# Patient Record
Sex: Female | Born: 2000 | Race: Black or African American | Hispanic: No | Marital: Single | Smoking: Never smoker
Health system: Southern US, Community
[De-identification: ages and names within clinical notes are randomized; demographics above are authoritative.]

## PROBLEM LIST (undated history)

## (undated) DIAGNOSIS — J45909 Unspecified asthma, uncomplicated: Secondary | ICD-10-CM

---
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
## 2021-01-26 DIAGNOSIS — Z419 Encounter for procedure for purposes other than remedying health state, unspecified: Secondary | ICD-10-CM | POA: Diagnosis not present

## 2021-02-25 DIAGNOSIS — Z419 Encounter for procedure for purposes other than remedying health state, unspecified: Secondary | ICD-10-CM | POA: Diagnosis not present

## 2021-03-28 DIAGNOSIS — Z419 Encounter for procedure for purposes other than remedying health state, unspecified: Secondary | ICD-10-CM | POA: Diagnosis not present

## 2021-04-27 DIAGNOSIS — Z419 Encounter for procedure for purposes other than remedying health state, unspecified: Secondary | ICD-10-CM | POA: Diagnosis not present

## 2023-01-21 ENCOUNTER — Other Ambulatory Visit: Payer: Self-pay

## 2023-01-21 ENCOUNTER — Encounter (HOSPITAL_COMMUNITY): Payer: Self-pay

## 2023-01-21 ENCOUNTER — Emergency Department (HOSPITAL_COMMUNITY)
Admission: EM | Admit: 2023-01-21 | Discharge: 2023-01-21 | Disposition: A | Discharge status: Home / Self Care | Payer: Medicaid Other | Attending: Emergency Medicine | Admitting: Emergency Medicine

## 2023-01-21 DIAGNOSIS — S199XXA Unspecified injury of neck, initial encounter: Secondary | ICD-10-CM | POA: Diagnosis present

## 2023-01-21 DIAGNOSIS — S39012A Strain of muscle, fascia and tendon of lower back, initial encounter: Secondary | ICD-10-CM | POA: Diagnosis not present

## 2023-01-21 DIAGNOSIS — Y92410 Unspecified street and highway as the place of occurrence of the external cause: Secondary | ICD-10-CM | POA: Diagnosis not present

## 2023-01-21 DIAGNOSIS — S161XXA Strain of muscle, fascia and tendon at neck level, initial encounter: Secondary | ICD-10-CM | POA: Diagnosis not present

## 2023-01-21 DIAGNOSIS — R519 Headache, unspecified: Secondary | ICD-10-CM | POA: Insufficient documentation

## 2023-01-21 DIAGNOSIS — M25511 Pain in right shoulder: Secondary | ICD-10-CM | POA: Insufficient documentation

## 2023-01-21 MED ORDER — METHOCARBAMOL 500 MG PO TABS: 500.0000 mg | ORAL_TABLET | Freq: Two times a day (BID) | ORAL | 0 refills | Status: AC

## 2023-01-21 MED ORDER — ACETAMINOPHEN 325 MG PO TABS
650.0000 mg | ORAL_TABLET | Freq: Once | ORAL | Status: AC
  Administered 2023-01-21: 650 mg via ORAL
  Filled 2023-01-21: qty 2
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
## 2023-01-21 NOTE — Discharge Instructions (Signed)
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
## 2023-05-22 ENCOUNTER — Ambulatory Visit: Payer: Self-pay | Admitting: Family Medicine

## 2024-06-01 ENCOUNTER — Ambulatory Visit (HOSPITAL_COMMUNITY)
Admission: EM | Admit: 2024-06-01 | Discharge: 2024-06-01 | Disposition: A | Discharge status: Home / Self Care | Attending: Family Medicine | Admitting: Family Medicine

## 2024-06-01 ENCOUNTER — Encounter (HOSPITAL_COMMUNITY): Payer: Self-pay

## 2024-06-01 DIAGNOSIS — R1010 Upper abdominal pain, unspecified: Secondary | ICD-10-CM | POA: Diagnosis not present

## 2024-06-01 DIAGNOSIS — K219 Gastro-esophageal reflux disease without esophagitis: Secondary | ICD-10-CM | POA: Diagnosis not present

## 2024-06-01 LAB — POCT URINALYSIS DIP (MANUAL ENTRY)
Bilirubin, UA: NEGATIVE
Blood, UA: NEGATIVE
Glucose, UA: NEGATIVE mg/dL
Leukocytes, UA: NEGATIVE
Nitrite, UA: NEGATIVE
Protein Ur, POC: NEGATIVE mg/dL
Spec Grav, UA: 1.02 (ref 1.010–1.025)
Urobilinogen, UA: 1 U/dL
pH, UA: 7 (ref 5.0–8.0)

## 2024-06-01 MED ORDER — SUCRALFATE 1 G PO TABS: 1.0000 g | ORAL_TABLET | Freq: Three times a day (TID) | ORAL | 0 refills | Status: AC

## 2024-06-01 MED ORDER — PANTOPRAZOLE SODIUM 20 MG PO TBEC: 20.0000 mg | DELAYED_RELEASE_TABLET | Freq: Every day | ORAL | 0 refills | Status: AC

## 2024-06-01 MED ORDER — ALUM & MAG HYDROXIDE-SIMETH 200-200-20 MG/5ML PO SUSP
30.0000 mL | Freq: Once | ORAL | Status: AC
  Administered 2024-06-01: 30 mL via ORAL

## 2024-06-01 MED ORDER — ALUM & MAG HYDROXIDE-SIMETH 200-200-20 MG/5ML PO SUSP
ORAL | Status: AC
  Filled 2024-06-01: qty 30

## 2024-06-01 NOTE — ED Provider Notes (Signed)
 [facility]    CSN: [id number] Arrival date & time: 06/01/24  1523      History   Chief Complaint Chief Complaint  Patient presents with   Abdominal Pain   Emesis   Diarrhea    HPI Rebecca Fischer is a 23 y.o. female.   Patient presents today with a several day history of abdominal upset.  She reports a burning sensation in her upper abdomen that is worse when she eats something.  She denies a formal diagnosis of GERD but has taken medication for this in the past over-the-counter which has provided temporary relief of symptoms.  She has not tried anything with current episode.  She does report some associated diarrhea and did have an episode of vomiting yesterday.  Denies any associated melena, hematochezia, hematemesis, fever.  She denies any medication changes or antibiotic use.  Denies any known sick contacts, recent travel, suspicious food intake, regular alcohol or NSAID use.  She has not had any abdominal surgery.  No concern for pregnancy.  She reports discomfort is rated 4 and is rated the pain scale, described as a burning, no alleviating factors identified.    Past Medical History:  Diagnosis Date   Asthma     There are no active problems to display for this patient.   History reviewed. No pertinent surgical history.  OB History   No obstetric history on file.      Home Medications    Prior to Admission medications   Medication Sig Start Date End Date Taking? Authorizing Provider  pantoprazole  (PROTONIX ) 20 MG tablet Take 1 tablet (20 mg total) by mouth daily. 06/01/24  Yes Kaulder Zahner K, PA-C  sucralfate  (CARAFATE ) 1 g tablet Take 1 tablet (1 g total) by mouth 3 (three) times daily before meals. 06/01/24  Yes Stephie Xu K, PA-C  albuterol (PROVENTIL HFA;VENTOLIN HFA) 108 (90 BASE) MCG/ACT inhaler Inhale 2 puffs into the lungs every 6 (six) hours as needed for wheezing. Patient not taking: Reported on 06/01/2024    [provider]   methocarbamol  (ROBAXIN ) 500 MG tablet Take 1 tablet (500 mg total) by mouth 2 (two) times daily. Patient not taking: Reported on 06/01/2024 01/21/23   Prosperi, Sherlean DEL, PA-C    Family History History reviewed. No pertinent family history.  Social History Social History   Tobacco Use   Smoking status: Never   Smokeless tobacco: Never  Vaping Use   Vaping status: Never Used  Substance Use Topics   Alcohol use: Yes   Drug use: No     Allergies   Patient has no known allergies.   Review of Systems Review of Systems  Constitutional:  Positive for activity change. Negative for appetite change, fatigue and fever.  Gastrointestinal:  Positive for abdominal pain, diarrhea, nausea and vomiting. Negative for blood in stool and constipation.  Genitourinary:  Negative for dysuria, frequency and urgency.     Physical Exam Triage Vital Signs ED Triage Vitals [06/01/24 1713]  Encounter Vitals Group     BP (!) 92/59     Girls Systolic BP Percentile      Girls Diastolic BP Percentile      Boys Systolic BP Percentile      Boys Diastolic BP Percentile      Pulse Rate 68     Resp 14     Temp 98.5 F (36.9 C)     Temp Source Oral     SpO2 98 %  Weight      Height      Head Circumference      Peak Flow      Pain Score 4     Pain Loc      Pain Education      Exclude from Growth Chart    No data found.  Updated Vital Signs BP 95/60 (BP Location: Right Arm)   Pulse 70   Temp 98.5 F (36.9 C) (Oral)   Resp 14   LMP 05/24/2024 (Exact Date)   SpO2 98%   Visual Acuity Right Eye Distance:   Left Eye Distance:   Bilateral Distance:    Right Eye Near:   Left Eye Near:    Bilateral Near:     Physical Exam Vitals reviewed.  Constitutional:      General: She is awake. She is not in acute distress.    Appearance: Normal appearance. She is well-developed. She is not ill-appearing.     Comments: Very pleasant female appears stated age in no acute distress sitting  comfortably in exam room  HENT:     Head: Normocephalic and atraumatic.     Mouth/Throat:     Pharynx: Uvula midline. No oropharyngeal exudate or posterior oropharyngeal erythema.  Cardiovascular:     Rate and Rhythm: Normal rate and regular rhythm.     Heart sounds: Normal heart sounds, S1 normal and S2 normal. No murmur heard. Pulmonary:     Effort: Pulmonary effort is normal.     Breath sounds: Normal breath sounds. No wheezing, rhonchi or rales.     Comments: Clear auscultation bilaterally Abdominal:     General: Bowel sounds are normal.     Palpations: Abdomen is soft.     Tenderness: There is no abdominal tenderness. There is no right CVA tenderness, left CVA tenderness, guarding or rebound. Negative signs include Murphy's sign.     Comments: No significant tenderness palpation on exam.  Psychiatric:        Behavior: Behavior is cooperative.      UC Treatments / Results  Labs (all labs ordered are listed, but only abnormal results are displayed) Labs Reviewed  POCT URINALYSIS DIP (MANUAL ENTRY) - Abnormal; Notable for the following components:      Result Value   Ketones, POC UA small (15) (*)    All other components within normal limits    EKG   Radiology No results found.  Procedures Procedures (including critical care time)  Medications Ordered in UC Medications  alum & mag hydroxide-simeth (MAALOX/MYLANTA) 200-200-20 MG/5ML suspension 30 mL (30 mLs Oral Given 06/01/24 1815)    Initial Impression / Assessment and Plan / UC Course  I have reviewed the triage vital signs and the nursing notes.  Pertinent labs & imaging results that were available during my care of the patient were reviewed by me and considered in my medical decision making (see chart for details).     Patient is well-appearing, afebrile, nontoxic, nontachycardic.  Vital signs of physical exam are reassuring with no indication for emergent evaluation or imaging.  Concern for GERD versus  gastritis contributing to symptoms.  She was given Maalox with resolution of symptoms and so we will treat for gastritis with Carafate  and Protonix .  We discussed that she should use dietary and lifestyle modification for additional symptom relief.  I also gave her the information for gastroenterology and encouraged her to follow-up with them if her symptoms do not resolve quickly with her medication regimen as she  may benefit from EGD versus testing for H. pylori if she has persistent symptoms.  We discussed that if anything worsens and she has severe pain, melena, hematochezia, hematemesis, nausea/vomiting interfering with oral intake, focal abdominal pain she needs to be seen emergently.  Strict return precautions given.  Excuse note provided.  Final Clinical Impressions(s) / UC Diagnoses   Final diagnoses:  Gastroesophageal reflux disease without esophagitis  Upper abdominal pain     Discharge Instructions      I am glad that you are feeling better after the medication.  I suspect you have acid reflux.  Start Carafate  3 times daily before meals to help coat your stomach for the next week.  Start Protonix  on an empty stomach (30 minutes before meal or 2 hours after) for 2 weeks.  I do recommend that you follow-up with gastroenterology for symptoms not improving quickly.  Avoid spicy/acidic/fatty foods.  Avoid alcohol.  Avoid NSAIDs (aspirin, ibuprofen /Advil , naproxen/Aleve).  If you have any severe abdominal pain, nausea/vomiting despite the medication, blood in your stool, blood in your vomit, weakness you need to go to the ER.     ED Prescriptions     Medication Sig Dispense Auth. Provider   pantoprazole  (PROTONIX ) 20 MG tablet Take 1 tablet (20 mg total) by mouth daily. 14 tablet Jakeira Seeman K, PA-C   sucralfate  (CARAFATE ) 1 g tablet Take 1 tablet (1 g total) by mouth 3 (three) times daily before meals. 21 tablet Maxmillian Carsey K, PA-C      PDMP not reviewed this encounter.   Sherrell Rocky POUR, PA-C 06/01/24 1903

## 2024-06-01 NOTE — ED Triage Notes (Signed)
 Patient reports a burning mid abdominal pain, N/V/D x 4 days. Patient states th epain is worse after eating.  Patient denies taking any medications for her symptoms.

## 2024-06-01 NOTE — Discharge Instructions (Signed)
 I am glad that you are feeling better after the medication.  I suspect you have acid reflux.  Start Carafate  3 times daily before meals to help coat your stomach for the next week.  Start Protonix  on an empty stomach (30 minutes before meal or 2 hours after) for 2 weeks.  I do recommend that you follow-up with gastroenterology for symptoms not improving quickly.  Avoid spicy/acidic/fatty foods.  Avoid alcohol.  Avoid NSAIDs (aspirin, ibuprofen /Advil , naproxen/Aleve).  If you have any severe abdominal pain, nausea/vomiting despite the medication, blood in your stool, blood in your vomit, weakness you need to go to the ER.
